# Patient Record
Sex: Male | Born: 1990 | Race: White | Hispanic: No | Marital: Single | Smoking: Never smoker
Health system: Southern US, Community
[De-identification: ages and names within clinical notes are randomized; demographics above are authoritative.]

---

## 2016-10-30 ENCOUNTER — Encounter: Payer: Self-pay | Admitting: *Deleted

## 2016-10-30 ENCOUNTER — Emergency Department: Payer: Managed Care, Other (non HMO)

## 2016-10-30 ENCOUNTER — Emergency Department
Admission: EM | Admit: 2016-10-30 | Discharge: 2016-10-30 | Disposition: A | Discharge status: Home / Self Care | Payer: Managed Care, Other (non HMO) | Attending: Emergency Medicine | Admitting: Emergency Medicine

## 2016-10-30 DIAGNOSIS — S3992XA Unspecified injury of lower back, initial encounter: Secondary | ICD-10-CM | POA: Diagnosis present

## 2016-10-30 DIAGNOSIS — Y999 Unspecified external cause status: Secondary | ICD-10-CM | POA: Diagnosis not present

## 2016-10-30 DIAGNOSIS — Y9239 Other specified sports and athletic area as the place of occurrence of the external cause: Secondary | ICD-10-CM | POA: Diagnosis not present

## 2016-10-30 DIAGNOSIS — S39012A Strain of muscle, fascia and tendon of lower back, initial encounter: Secondary | ICD-10-CM | POA: Diagnosis not present

## 2016-10-30 DIAGNOSIS — X500XXA Overexertion from strenuous movement or load, initial encounter: Secondary | ICD-10-CM | POA: Insufficient documentation

## 2016-10-30 DIAGNOSIS — Y93F2 Activity, caregiving, lifting: Secondary | ICD-10-CM | POA: Insufficient documentation

## 2016-10-30 MED ORDER — CYCLOBENZAPRINE HCL 10 MG PO TABS: 10.0000 mg | ORAL_TABLET | Freq: Three times a day (TID) | ORAL | 0 refills | Status: DC | PRN

## 2016-10-30 MED ORDER — KETOROLAC TROMETHAMINE 30 MG/ML IJ SOLN
30.0000 mg | Freq: Once | INTRAMUSCULAR | Status: AC
  Administered 2016-10-30: 30 mg via INTRAMUSCULAR
  Filled 2016-10-30: qty 1

## 2016-10-30 MED ORDER — NAPROXEN 500 MG PO TABS: 500.0000 mg | ORAL_TABLET | Freq: Two times a day (BID) | ORAL | 0 refills | Status: DC

## 2016-10-30 NOTE — ED Provider Notes (Signed)
[facility] Emergency Department Provider Note  ____________________________________________  Time seen: Approximately 11:46 AM  I have reviewed the triage vital signs and the nursing notes.   HISTORY  Chief Complaint Back Pain    HPI Roberto Salas is a 26 y.o. male , NAD, presents to the emergency department for evaluation of low back pain. Patient states he did left and 400 pounds of weight yesterday while at the gym. Had onset of lower back pain after such. [place] onset of numbness, weakness or tingling. Had no saddle paresthesias or loss of bowel or bladder control. States when he woke this morning his pain significantly escalated and he felt somewhat tight. Has not taken anything for his symptoms. Denies fall or blunt trauma or injury to his back. Has not noted any redness, swelling or bruising. Does note that he typically lives approximately 250 pounds without difficulty but was trying to assess his maximum weight limit for lifting.   History reviewed. No pertinent past medical history.  There are no active problems to display for this patient.   No past surgical history on file.  Prior to Admission medications   Not on File    Allergies Patient has no known allergies.  History reviewed. No pertinent family history.  Social History Social History  Substance Use Topics  . Smoking status: Not on file  . Smokeless tobacco: Not on file  . Alcohol use Not on file     Review of Systems  Constitutional: No fatigue Cardiovascular: No chest pain. Respiratory: No shortness of breath.  Musculoskeletal: Positive for lower back pain. No neck pain. Skin: Negative for rash, Redness, swelling, bruising. Neurological: Negative for Numbness, weakness, tingling. No saddle paresthesias or loss of bowel or bladder control. 10-point ROS otherwise negative.  ____________________________________________   PHYSICAL EXAM:  VITAL SIGNS: ED Triage  Vitals  Enc Vitals Group     BP 10/30/16 1124 (!) 162/91     Pulse Rate 10/30/16 1124 72     Resp 10/30/16 1124 18     Temp 10/30/16 1124 98.5 F (36.9 C)     Temp Source 10/30/16 1124 Oral     SpO2 10/30/16 1124 98 %     Weight 10/30/16 1123 227 lb (103 kg)     Height 10/30/16 1123 5\' 11"  (1.803 m)     Head Circumference --      Peak Flow --      Pain Score 10/30/16 1123 7     Pain Loc --      Pain Edu? --      Excl. in GC? --      Constitutional: Alert and oriented. Well appearing and in no acute distress. Eyes: Conjunctivae are normal.  Head: Atraumatic. Neck: No cervical spine tenderness to palpation. Supple with full range of motion. Hematological/Lymphatic/Immunilogical: No cervical lymphadenopathy. Cardiovascular:  Good peripheral circulation. Respiratory: Normal respiratory effort without tachypnea or retractions.  Musculoskeletal: Tenderness to palpation of the midline lower area of the lumbar spine without bony abnormalities or step off. Mild paraspinal tenderness bilaterally about the lumbar spine. No SI joint tenderness bilaterally. Negative bilateral straight leg raise. No lower extremity tenderness nor edema.  No joint effusions. Neurologic:  Normal speech and language. Normal gait and posture. No gross focal neurologic deficits are appreciated. Sensation to light touch grossly intact by bilateral lower extremities. Skin:  Skin is warm, dry and intact. No rash, redness, swelling, bruising noted. Psychiatric: Mood and affect are normal. Speech and behavior are  normal. Patient exhibits appropriate insight and judgement.   ____________________________________________   LABS  None ____________________________________________  EKG  None ____________________________________________  RADIOLOGY I, Ernestene Kiel Kyrielle Urbanski, personally viewed and evaluated these images (plain radiographs) as part of my medical decision making, as well as reviewing the written report by the  radiologist.  Dg Lumbar Spine 2-3 Views  Result Date: 10/30/2016 CLINICAL DATA:  Worsening low back pain with pain and tingling extending into the left leg since injury lifting 400 pound weight yesterday. EXAM: LUMBAR SPINE - 2-3 VIEW COMPARISON:  None in PACs FINDINGS: There is a transitional S1 vertebral body. The lumbar vertebral bodies are preserved in height. The disc space heights are well maintained. There is no spondylolisthesis of L1 through L5. There is mild anterolisthesis of S1 with respect L5 amounting to approximately 5 mm. There is no pars defect. The pedicles and transverse processes are intact. The observed portions of the sacrum exhibit no acute abnormality. IMPRESSION: There is no acute bony abnormality of the lumbar spine. There is no significant disc space narrowing. If the patient's symptoms persist, lumbar spine MRI would be a useful next imaging step. Electronically Signed   By: David  [place] M.D.   On: 10/30/2016 12:06    ____________________________________________    PROCEDURES  Procedure(s) performed: None   Procedures   Medications  ketorolac (TORADOL) 30 MG/ML injection 30 mg (30 mg Intramuscular Given 10/30/16 1150)    ____________________________________________   INITIAL IMPRESSION / ASSESSMENT AND PLAN / ED COURSE  Pertinent labs & imaging results that were available during my care of the patient were reviewed by me and considered in my medical decision making (see chart for details).     Patient's diagnosis is consistent with Strain of lumbar region. Patient was given IM Toradol while in the emergency department and tolerated well with notable decrease in pain. Patient will be discharged home with prescriptions for naproxen and Flexeril to take as directed. Patient is to alternate applying heat and ice to the affected area 20 minutes 3-4 times daily. Light range of motion and stretching as discussed. No weightlifting or gym workouts over the next 7  days. Patient is to follow up with Dr. Hyacinth Meeker in orthopedics if symptoms persist past this treatment course. Patient is given ED precautions to return to the ED for any worsening or new symptoms.   ____________________________________________  FINAL CLINICAL IMPRESSION(S) / ED DIAGNOSES  Final diagnoses:  Strain of lumbar region, initial encounter    NEW MEDICATIONS STARTED DURING THIS VISIT:  New Prescriptions   No medications on file         Hope Pigeon, PA-C 10/31/16 1151    Jene Every, MD 10/31/16 1234

## 2016-10-30 NOTE — Discharge Instructions (Signed)
No heavy lifting over the next 5 days. Ice to the affected area 20 minutes 3-4 times daily as needed for the first 2 days then switch to warm heat. Please complete light range of motion and stretching exercises daily.

## 2016-10-30 NOTE — ED Notes (Signed)
Provider notified about BP  , pt informed about following up to recheck BP

## 2016-10-30 NOTE — ED Triage Notes (Signed)
States lower back pain after working out and doing a dead Mudloggerlift

## 2017-03-05 ENCOUNTER — Emergency Department
Admission: EM | Admit: 2017-03-05 | Discharge: 2017-03-05 | Disposition: A | Discharge status: Home / Self Care | Payer: Managed Care, Other (non HMO) | Attending: Emergency Medicine | Admitting: Emergency Medicine

## 2017-03-05 ENCOUNTER — Encounter: Payer: Self-pay | Admitting: Emergency Medicine

## 2017-03-05 DIAGNOSIS — Y929 Unspecified place or not applicable: Secondary | ICD-10-CM | POA: Insufficient documentation

## 2017-03-05 DIAGNOSIS — Y939 Activity, unspecified: Secondary | ICD-10-CM | POA: Diagnosis not present

## 2017-03-05 DIAGNOSIS — Y999 Unspecified external cause status: Secondary | ICD-10-CM | POA: Diagnosis not present

## 2017-03-05 DIAGNOSIS — S39012A Strain of muscle, fascia and tendon of lower back, initial encounter: Secondary | ICD-10-CM | POA: Insufficient documentation

## 2017-03-05 DIAGNOSIS — X509XXA Other and unspecified overexertion or strenuous movements or postures, initial encounter: Secondary | ICD-10-CM | POA: Diagnosis not present

## 2017-03-05 DIAGNOSIS — S3992XA Unspecified injury of lower back, initial encounter: Secondary | ICD-10-CM | POA: Diagnosis present

## 2017-03-05 MED ORDER — METHOCARBAMOL 500 MG PO TABS
1000.0000 mg | ORAL_TABLET | Freq: Once | ORAL | Status: AC
  Administered 2017-03-05: 1000 mg via ORAL
  Filled 2017-03-05: qty 2

## 2017-03-05 MED ORDER — IBUPROFEN 800 MG PO TABS: 800.0000 mg | ORAL_TABLET | Freq: Three times a day (TID) | ORAL | 0 refills | Status: DC

## 2017-03-05 MED ORDER — KETOROLAC TROMETHAMINE 30 MG/ML IJ SOLN
30.0000 mg | Freq: Once | INTRAMUSCULAR | Status: AC
  Administered 2017-03-05: 30 mg via INTRAMUSCULAR
  Filled 2017-03-05: qty 1

## 2017-03-05 MED ORDER — METHOCARBAMOL 500 MG PO TABS: ORAL_TABLET | ORAL | 0 refills | Status: DC

## 2017-03-05 MED ORDER — METHOCARBAMOL 1000 MG/10ML IJ SOLN: 1000.0000 mg | Freq: Once | INTRAMUSCULAR | Status: DC

## 2017-03-05 NOTE — Discharge Instructions (Signed)
Moist heat or ice to your  back as needed for comfort. Continue taking medication as directed. Do not drive or operate machinery while taking Robaxin (methocarbamol). Follow-up with your primary care or can no clinic if any continued problems.

## 2017-03-05 NOTE — ED Provider Notes (Signed)
[facility] Emergency Department Provider Note  ____________________________________________   First MD Initiated Contact with Patient 03/05/17 [phone]     (approximate)  I have reviewed the triage vital signs and the nursing notes.   HISTORY  Chief Complaint Back Pain    HPI Roberto Salas is a 26 y.o. male patient is here with complaint of back pain when he bent over to pick up something last night. He states that he felt "a pop in his back". Patient has had problems with his back and was seen here after lifting weights and having low back pain. At that time he was told his back x-rays were negative. Patient denies any bowel or bladder incontinence. He denies any paresthesias to his lower extremities or saddle anesthesias. Patient has continued to be ambulatory. He states that movement increases his pain. Currently rates his pain is 7 out of 10. Patient has continued to be ambulatory.   History reviewed. No pertinent past medical history.  There are no active problems to display for this patient.   History reviewed. No pertinent surgical history.  Prior to Admission medications   Medication Sig Start Date End Date Taking? Authorizing Provider  ibuprofen (ADVIL,MOTRIN) 800 MG tablet Take 1 tablet (800 mg total) by mouth 3 (three) times daily. 03/05/17   Tommi Rumps, PA-C  methocarbamol (ROBAXIN) 500 MG tablet 1-2 tablets qid prn spasms 03/05/17   Tommi Rumps, PA-C    Allergies Patient has no known allergies.  History reviewed. No pertinent family history.  Social History Social History  Substance Use Topics  . Smoking status: Never Smoker  . Smokeless tobacco: Never Used  . Alcohol use Yes    Review of Systems Constitutional: No fever/chills Cardiovascular: Denies chest pain. Respiratory: Denies shortness of breath. Gastrointestinal: No abdominal pain.  No nausea, no vomiting.  Genitourinary: Negative for  dysuria. Musculoskeletal: Positive for back pain. Neurological: Negative for focal weakness or numbness.   ____________________________________________   PHYSICAL EXAM:  VITAL SIGNS: ED Triage Vitals  Enc Vitals Group     BP 03/05/17 0757 138/85     Pulse Rate 03/05/17 0757 (!) 58     Resp 03/05/17 0757 20     Temp 03/05/17 0757 97.9 F (36.6 C)     Temp Source 03/05/17 0757 Oral     SpO2 03/05/17 0757 97 %     Weight 03/05/17 0757 223 lb (101.2 kg)     Height 03/05/17 0757 5\' 11"  (1.803 m)     Head Circumference --      Peak Flow --      Pain Score 03/05/17 0756 7     Pain Loc --      Pain Edu? --      Excl. in GC? --     Constitutional: Alert and oriented. Well appearing and in no acute distress. Eyes: Conjunctivae are normal.  Head: Atraumatic. Neck: No stridor.   Cardiovascular: Normal rate, regular rhythm. Grossly normal heart sounds.  Good peripheral circulation. Respiratory: Normal respiratory effort.  No retractions. Lungs CTAB. Gastrointestinal: Soft and nontender. No distention.  No CVA tenderness. Musculoskeletal: Examinations back there is no gross deformity noted. There is no tenderness on palpation of the vertebral bodies lumbar spine. There is some tenderness on the paravertebral muscles bilaterally. Range of motion is slightly restricted secondary to discomfort. No active muscle spasms were seen. Straight leg raises were limited to approximately 40 secondary to pain bilaterally. Reflexes were equal bilaterally. Good  muscle strength and patient was ambulatory. Neurologic:  Normal speech and language. No gross focal neurologic deficits are appreciated. No gait instability. Skin:  Skin is warm, dry and intact. No rash noted. Psychiatric: Mood and affect are normal. Speech and behavior are normal.  ____________________________________________   LABS (all labs ordered are listed, but only abnormal results are displayed)  Labs Reviewed - No data to  display  PROCEDURES  Procedure(s) performed: None  Procedures  Critical Care performed: No  ____________________________________________   INITIAL IMPRESSION / ASSESSMENT AND PLAN / ED COURSE  Pertinent labs & imaging results that were available during my care of the patient were reviewed by me and considered in my medical decision making (see chart for details).  Patient was given Toradol 30 mg IM along with Robaxin 1000 mg by mouth. Patient had moderate amount of improvement prior to discharge. Patient was discharged with prescription for Robaxin one or 2 tablets 4 times a day as needed for muscle spasms and ibuprofen 800 mg 3 times a day with food. He is encouraged to use ice or heat to his back as needed.      ____________________________________________   FINAL CLINICAL IMPRESSION(S) / ED DIAGNOSES  Final diagnoses:  Strain of lumbar region, initial encounter      NEW MEDICATIONS STARTED DURING THIS VISIT:  Discharge Medication List as of 03/05/2017  9:24 AM    START taking these medications   Details  ibuprofen (ADVIL,MOTRIN) 800 MG tablet Take 1 tablet (800 mg total) by mouth 3 (three) times daily., Starting Thu 03/05/2017, Print    methocarbamol (ROBAXIN) 500 MG tablet 1-2 tablets qid prn spasms, Print         Note:  This document was prepared using Dragon voice recognition software and may include unintentional dictation errors.    Tommi RumpsSummers, Maha Fischel L, PA-C 03/05/17 1558    Governor RooksLord, Rebecca, MD 03/06/17 [phone]

## 2017-03-05 NOTE — ED Triage Notes (Signed)
Pt reports hx of back pain when lifting weights several months ago, states last night he bent over to pick up something he felt his back pop. C/o low back pain worse with movement. No bowel or bladder loss. Ambulatory in lobby.

## 2017-07-05 IMAGING — CR DG LUMBAR SPINE 2-3V
1 series · 3 of 3 positions shown · non-contrast
Comparison: None in PACs

CLINICAL DATA: Worsening low back pain with pain and tingling
extending into the left leg since injury lifting 400 pound weight
yesterday.

EXAM:
LUMBAR SPINE - 2-3 VIEW

[Series 1: t lumbar spine ap · 0.14mm/px · 3 of 3 slices shown]
[im 1/3]
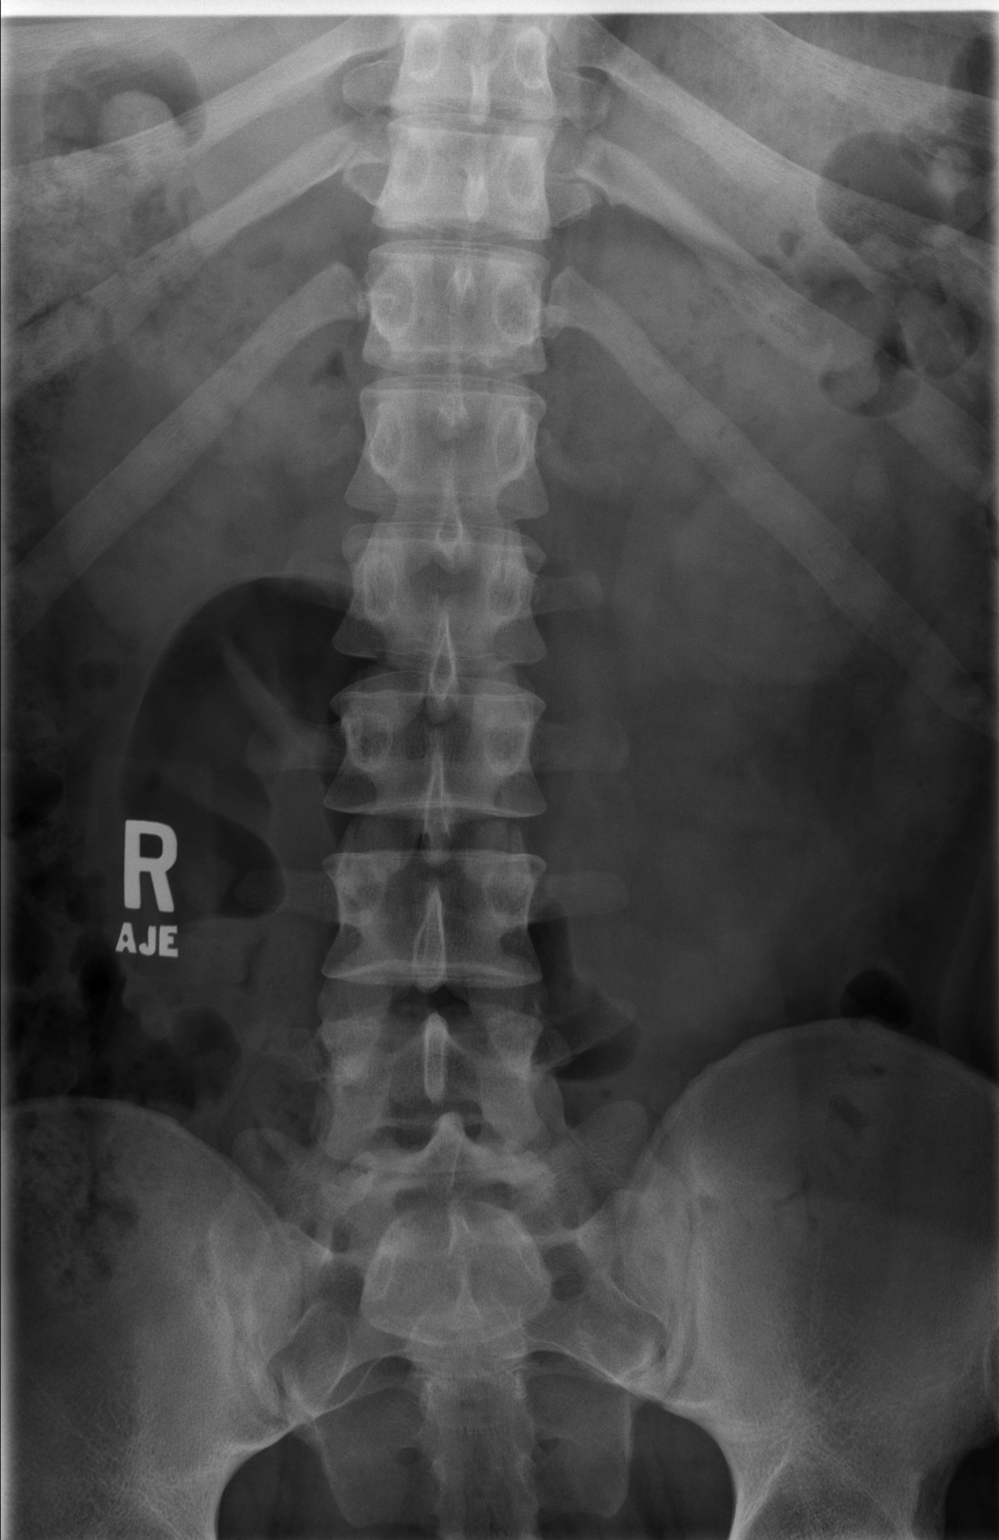
[im 2/3]
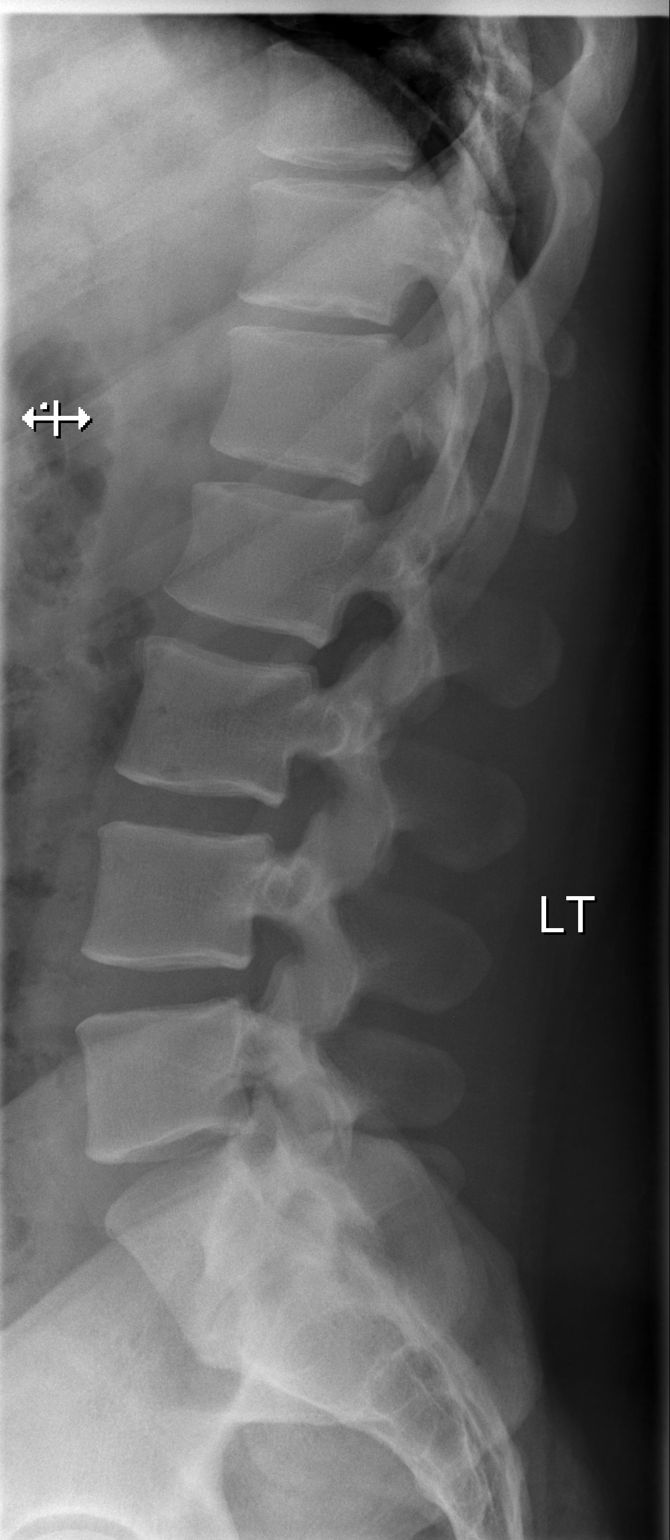
[im 3/3]
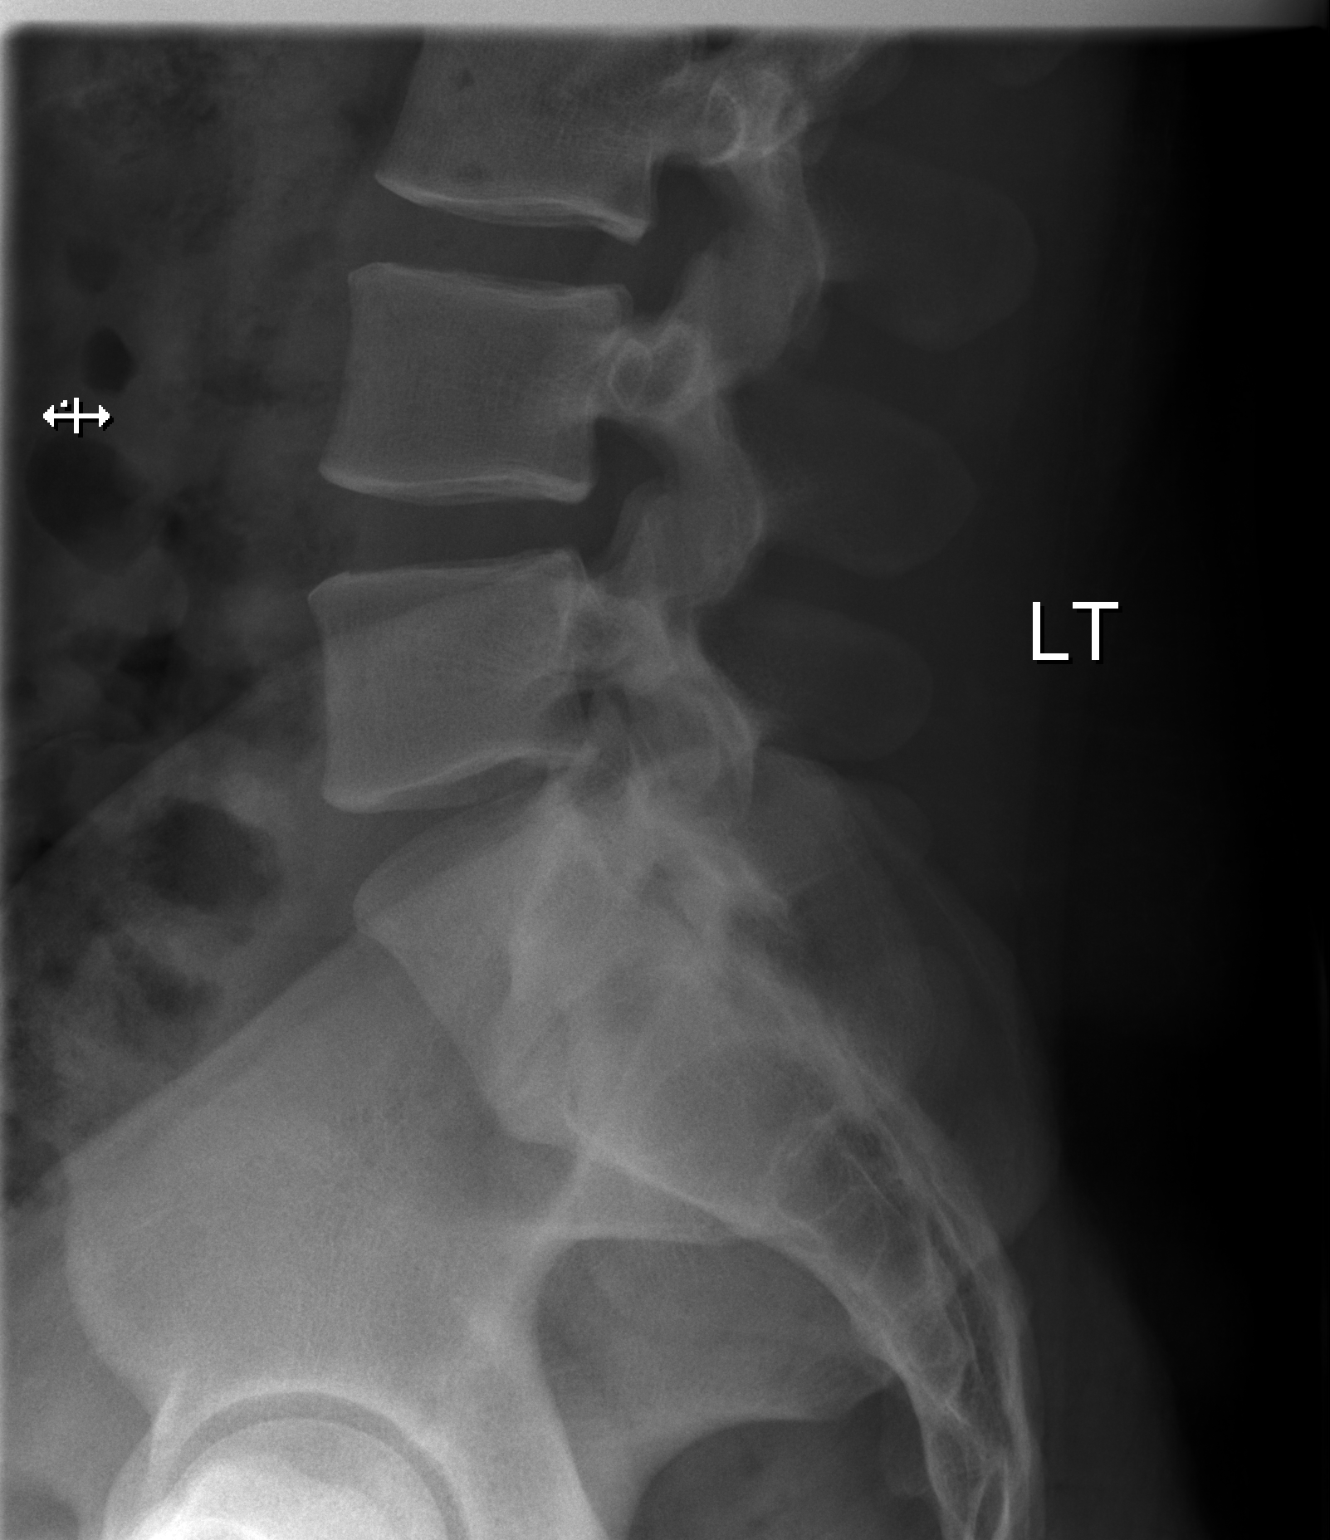

[3 of 3 positions shown; findings below may reference images not displayed]

FINDINGS: There is a transitional S1 vertebral body. The lumbar vertebral
bodies are preserved in height. The disc space heights are well
maintained. There is no spondylolisthesis of L1 through L5. There is
mild anterolisthesis of S1 with respect L5 amounting to
approximately 5 mm. There is no pars defect. The pedicles and
transverse processes are intact. The observed portions of the sacrum
exhibit no acute abnormality.
IMPRESSION: There is no acute bony abnormality of the lumbar spine. There is no
significant disc space narrowing. If the patient's symptoms persist,
lumbar spine MRI would be a useful next imaging step.

## 2019-05-19 ENCOUNTER — Emergency Department
Admission: EM | Admit: 2019-05-19 | Discharge: 2019-05-19 | Disposition: A | Discharge status: Home / Self Care | Payer: Commercial Managed Care - PPO | Attending: Emergency Medicine | Admitting: Emergency Medicine

## 2019-05-19 ENCOUNTER — Other Ambulatory Visit: Payer: Self-pay

## 2019-05-19 DIAGNOSIS — Z791 Long term (current) use of non-steroidal anti-inflammatories (NSAID): Secondary | ICD-10-CM | POA: Insufficient documentation

## 2019-05-19 DIAGNOSIS — K0889 Other specified disorders of teeth and supporting structures: Secondary | ICD-10-CM | POA: Diagnosis not present

## 2019-05-19 MED ORDER — KETOROLAC TROMETHAMINE 30 MG/ML IJ SOLN
30.0000 mg | Freq: Once | INTRAMUSCULAR | Status: AC
  Administered 2019-05-19: 23:00:00 30 mg via INTRAMUSCULAR
  Filled 2019-05-19: qty 1

## 2019-05-19 MED ORDER — ONDANSETRON 4 MG PO TBDP
4.0000 mg | ORAL_TABLET | Freq: Once | ORAL | Status: AC
  Administered 2019-05-19: 23:00:00 4 mg via ORAL
  Filled 2019-05-19: qty 1

## 2019-05-19 MED ORDER — KETOROLAC TROMETHAMINE 10 MG PO TABS: 10.0000 mg | ORAL_TABLET | Freq: Four times a day (QID) | ORAL | 0 refills | Status: AC | PRN

## 2019-05-19 MED ORDER — OXYCODONE-ACETAMINOPHEN 5-325 MG PO TABS
1.0000 | ORAL_TABLET | Freq: Once | ORAL | Status: AC
  Administered 2019-05-19: 1 via ORAL
  Filled 2019-05-19: qty 1

## 2019-05-19 NOTE — ED Notes (Signed)
No answer when called several times from lobby 

## 2019-05-19 NOTE — Discharge Instructions (Signed)
OPTIONS FOR DENTAL FOLLOW UP CARE ° °[place] [facility] and [facility] - Local Safety Net Dental Clinics °http://www.ncdhhs.gov/dph/oralhealth/services/safetynetclinics.htm °  °[facility] (336-562-3123) ° °[facility] [place] ([phone]) ° °[facility] ([phone] ext 237) ° °[facility] ([phone]) ° °[facility] ([phone]) °This clinic caters to the indigent population and is on a lottery system. °Location: °[facility], [address], [place] °Clinic Hours: °Wednesdays from 6pm - 9pm, patients seen by a lottery system. °For dates, call or go to www.med.[facility].edu/[facility]/patients/Dental-[facility] °Services: °Cleanings, fillings and simple extractions. °Payment Options: °DENTAL WORK IS FREE OF CHARGE. Bring proof of income or support. °Best way to get seen: °Arrive at 5:15 pm - this is a lottery, NOT first come/first serve, so arriving earlier will not increase your chances of being seen. °  °  °[facility] °[phone] °Select option 1 for emergencies °  °Location: °[facility], [address], [place] °Clinic Hours: °No walk-ins accepted - call the day before to schedule an appointment. °Check in times are 9:30 am and 1:30 pm. °Services: °Simple extractions, temporary fillings, pulpectomy/pulp debridement, uncomplicated abscess drainage. °Payment Options: °PAYMENT IS DUE AT THE TIME OF SERVICE.  Fee is usually $100-200, additional surgical procedures (e.g. abscess drainage) may be extra. °Cash, checks, Visa/MasterCard accepted.  Can file Medicaid if patient is covered for dental - patient should call case worker to check. °No discount for [facility] patients. °Best way to get seen: °MUST call the day before and get onto the schedule. Can usually be seen the next 1-2 days. No walk-ins accepted. °  °  °[facility] °[phone] °   °Location: °[facility], [address], [place] °Clinic Hours: °M, W, Th, F 8am or 1:30pm, Tues 9a or 1:30 - first come/first served. °Services: °Simple extractions, temporary fillings, uncomplicated abscess drainage.  You do not need to be an [place] resident. °Payment Options: °PAYMENT IS DUE AT THE TIME OF SERVICE. °Dental insurance, otherwise sliding scale - bring proof of income or support. °Depending on income and treatment needed, cost is usually $50-200. °Best way to get seen: °Arrive early as it is first come/first served. °  °  °[facility] °[phone] °  °Location: °[address] °Clinic Hours: °Mon-Thu 8a-5p °Services: °Most basic dental services including extractions and fillings. °Payment Options: °PAYMENT IS DUE AT THE TIME OF SERVICE. °Sliding scale, up to 50% off - bring proof if income or support. °Medicaid with dental option accepted. °Best way to get seen: °Call to schedule an appointment, can usually be seen within 2 weeks OR they will try to see walk-ins - show up at 8a or 2p (you may have to wait). °  °  °[facility] °919-245-2435 °[place] RESIDENTS ONLY °  °Location: °[facility], [address], [place], [place] [address] °Clinic Hours: By appointment only. °Monday - Thursday 8am-5pm, Friday 8am-12pm °Services: Cleanings, fillings, extractions. °Payment Options: °PAYMENT IS DUE AT THE TIME OF SERVICE. °Cash, Visa or MasterCard. Sliding scale - $30 minimum per service. °Best way to get seen: °Come in to office, complete packet and make an appointment - need proof of income °or support monies for each household member and proof of [place] residence. °Usually takes about a month to get in. °  °  °[facility] °[phone] °  °Location: °[address]   [place] °Clinic Hours: Walk-in Urgent Care Dental Services are offered Monday-Friday  mornings only. °The numbers of emergencies accepted daily is limited to the number of °providers available. °Maximum 15 - Mondays, Wednesdays & Thursdays °Maximum 10 - Tuesdays & Fridays °Services: °You do not need to be a [place] resident to be seen for a dental emergency. °Emergencies are defined as pain, swelling, abnormal bleeding, or dental trauma. Walkins will receive x-rays if needed. °NOTE: Dental cleaning is not an emergency. °Payment Options: °PAYMENT IS DUE AT THE TIME OF SERVICE. °Minimum co-pay is $40.00 for uninsured patients. °Minimum co-pay is $3.00 for Medicaid with dental coverage. °Dental Insurance is accepted and must be presented at time of visit. °Medicare does not cover dental. °Forms of payment: Cash, credit card, checks. °Best way to get seen: °If not previously registered with the clinic, walk-in dental registration begins at 7:15 am and is on a first come/first serve basis. °If previously registered with the clinic, call to make an appointment. °  °  °[facility] °[phone] °[place] RESIDENTS ONLY °  °Location: °[address], [place], [place] °Clinic Hours: °Mon-Thu 10a-2p °Services: Extractions only! °Payment Options: °FREE (donations accepted) - bring proof of income or support °Best way to get seen: °Call and schedule an appointment OR come at 8am on the 1st Monday of every month (except for holidays) when it is first come/first served. °  °  °[facility] °[phone] °  °Location: °[address], [place] °Clinic Hours: °Friday mornings °Services, Payment Options, Best way to get seen: °Call for info °

## 2019-05-19 NOTE — ED Triage Notes (Signed)
Pt arrives to ED via POV from home with c/o left, lower-sided dental pain x14 hrs. No fever; no trouble swallowing or speaking. Pt reports taking IBU q4-6hrs without relief.

## 2019-05-19 NOTE — ED Notes (Signed)
Unable to obtain oral temp; pt drinking water in Triage.

## 2019-05-19 NOTE — ED Provider Notes (Signed)
[facility] Emergency Department Provider Note  ____________________________________________  Time seen: Approximately 11:22 PM  I have reviewed the triage vital signs and the nursing notes.   HISTORY  Chief Complaint Dental Pain    HPI Roberto Salas is a 28 y.o. male presents to the emergency department with a broken inferior 21 tooth.  Patient has an appointment with a local dentist tomorrow and states that he is seeking pain management so that he can rest tonight.  He denies pain underneath the tongue or swelling at the neck.  No fever noted at home.  Patient has been taking ibuprofen and Tylenol.        History reviewed. No pertinent past medical history.  There are no active problems to display for this patient.   History reviewed. No pertinent surgical history.  Prior to Admission medications   Medication Sig Start Date End Date Taking? Authorizing Provider  ibuprofen (ADVIL,MOTRIN) 800 MG tablet Take 1 tablet (800 mg total) by mouth 3 (three) times daily. 03/05/17   Tommi RumpsSummers, Rhonda L, PA-C  ketorolac (TORADOL) 10 MG tablet Take 1 tablet (10 mg total) by mouth every 6 (six) hours as needed for up to 5 days. 05/19/19 05/24/19  Orvil FeilWoods, Amalee Olsen M, PA-C  methocarbamol (ROBAXIN) 500 MG tablet 1-2 tablets qid prn spasms 03/05/17   Tommi RumpsSummers, Rhonda L, PA-C    Allergies Patient has no known allergies.  No family history on file.  Social History Social History   Tobacco Use  . Smoking status: Never Smoker  . Smokeless tobacco: Never Used  Substance Use Topics  . Alcohol use: Yes  . Drug use: Not on file     Review of Systems  Constitutional: No fever/chills Eyes: No visual changes. No discharge ENT: Patient has dental pain. Cardiovascular: no chest pain. Respiratory: no cough. No SOB. Gastrointestinal: No abdominal pain.  No nausea, no vomiting.  No diarrhea.  No constipation. Genitourinary: Negative for dysuria. No  hematuria Musculoskeletal: Negative for musculoskeletal pain. Skin: Negative for rash, abrasions, lacerations, ecchymosis. Neurological: Negative for headaches, focal weakness or numbness.   ____________________________________________   PHYSICAL EXAM:  VITAL SIGNS: ED Triage Vitals  Enc Vitals Group     BP 05/19/19 2202 (!) 159/115     Pulse Rate 05/19/19 2202 (!) 58     Resp 05/19/19 2202 18     Temp --      Temp src --      SpO2 05/19/19 2202 99 %     Weight 05/19/19 2203 246 lb (111.6 kg)     Height 05/19/19 2203 5\' 11"  (1.803 m)     Head Circumference --      Peak Flow --      Pain Score 05/19/19 2203 10     Pain Loc --      Pain Edu? --      Excl. in GC? --      Constitutional: Alert and oriented. Well appearing and in no acute distress. Eyes: Conjunctivae are normal. PERRL. EOMI. Head: Atraumatic. ENT:      Ears:       Nose: No congestion/rhinnorhea.      Mouth/Throat: Mucous membranes are moist.  Patient has broken inferior 21.  No pain to palpation underneath the tongue. Neck: No stridor.  No cervical spine tenderness to palpation. Hematological/Lymphatic/Immunilogical: No cervical lymphadenopathy. Cardiovascular: Normal rate, regular rhythm. Normal S1 and S2.  Good peripheral circulation. Respiratory: Normal respiratory effort without tachypnea or retractions. Lungs CTAB. Good air  entry to the bases with no decreased or absent breath sounds. Musculoskeletal: Full range of motion to all extremities. No gross deformities appreciated. Neurologic:  Normal speech and language. No gross focal neurologic deficits are appreciated.  Skin:  Skin is warm, dry and intact. No rash noted. Psychiatric: Mood and affect are normal. Speech and behavior are normal. Patient exhibits appropriate insight and judgement.   ____________________________________________   LABS (all labs ordered are listed, but only abnormal results are displayed)  Labs Reviewed - No data to  display ____________________________________________  EKG   ____________________________________________  RADIOLOGY   No results found.  ____________________________________________    PROCEDURES  Procedure(s) performed:    Procedures    Medications  oxyCODONE-acetaminophen (PERCOCET/ROXICET) 5-325 MG per tablet 1 tablet (has no administration in time range)  ondansetron (ZOFRAN-ODT) disintegrating tablet 4 mg (has no administration in time range)  ketorolac (TORADOL) 30 MG/ML injection 30 mg (has no administration in time range)     ____________________________________________   INITIAL IMPRESSION / ASSESSMENT AND PLAN / ED COURSE  Pertinent labs & imaging results that were available during my care of the patient were reviewed by me and considered in my medical decision making (see chart for details).  Review of the [place] CSRS was performed in accordance of the [place] prior to dispensing any controlled drugs.          Assessment and Plan:  Dental pain 28 year old male presents to the emergency department with a broken inferior 21.  Patient is presenting to the emergency department for pain management.  [place] drug database was reviewed during this emergency department encounter.  Patient was given Percocet and Toradol for pain.  He was advised to keep appointment with local dentist tomorrow.  All patient questions were answered.  ____________________________________________  FINAL CLINICAL IMPRESSION(S) / ED DIAGNOSES  Final diagnoses:  Pain, dental      NEW MEDICATIONS STARTED DURING THIS VISIT:  ED Discharge Orders         Ordered    ketorolac (TORADOL) 10 MG tablet  Every 6 hours PRN     05/19/19 2320              This chart was dictated using voice recognition software/Dragon. Despite best efforts to proofread, errors can occur which can change the meaning. Any change was purely unintentional.    Lannie Fields, PA-C 05/19/19  2329    Harvest Dark, MD 05/20/19 2038

## 2020-12-11 ENCOUNTER — Emergency Department
Admission: EM | Admit: 2020-12-11 | Discharge: 2020-12-11 | Disposition: A | Discharge status: Home / Self Care | Payer: Commercial Managed Care - PPO | Attending: Emergency Medicine | Admitting: Emergency Medicine

## 2020-12-11 ENCOUNTER — Other Ambulatory Visit: Payer: Self-pay

## 2020-12-11 DIAGNOSIS — M5416 Radiculopathy, lumbar region: Secondary | ICD-10-CM | POA: Diagnosis not present

## 2020-12-11 DIAGNOSIS — X500XXA Overexertion from strenuous movement or load, initial encounter: Secondary | ICD-10-CM | POA: Insufficient documentation

## 2020-12-11 DIAGNOSIS — Y93F2 Activity, caregiving, lifting: Secondary | ICD-10-CM | POA: Diagnosis not present

## 2020-12-11 DIAGNOSIS — M545 Low back pain, unspecified: Secondary | ICD-10-CM | POA: Diagnosis present

## 2020-12-11 MED ORDER — IBUPROFEN 800 MG PO TABS: 800.0000 mg | ORAL_TABLET | Freq: Three times a day (TID) | ORAL | 0 refills | Status: AC | PRN

## 2020-12-11 MED ORDER — PREDNISONE 10 MG (21) PO TBPK: ORAL_TABLET | ORAL | 0 refills | Status: AC

## 2020-12-11 MED ORDER — OXYCODONE-ACETAMINOPHEN 5-325 MG PO TABS: 2.0000 | ORAL_TABLET | Freq: Four times a day (QID) | ORAL | 0 refills | Status: AC | PRN

## 2020-12-11 MED ORDER — KETOROLAC TROMETHAMINE 30 MG/ML IJ SOLN
30.0000 mg | Freq: Once | INTRAMUSCULAR | Status: AC
  Administered 2020-12-11: 30 mg via INTRAVENOUS
  Filled 2020-12-11: qty 1

## 2020-12-11 MED ORDER — METHOCARBAMOL 1000 MG/10ML IJ SOLN
500.0000 mg | Freq: Once | INTRAMUSCULAR | Status: AC
  Administered 2020-12-11: 500 mg via INTRAVENOUS
  Filled 2020-12-11: qty 5

## 2020-12-11 MED ORDER — DEXAMETHASONE SODIUM PHOSPHATE 10 MG/ML IJ SOLN
10.0000 mg | Freq: Once | INTRAMUSCULAR | Status: AC
  Administered 2020-12-11: 10 mg via INTRAVENOUS
  Filled 2020-12-11: qty 1

## 2020-12-11 MED ORDER — ONDANSETRON 4 MG PO TBDP: 4.0000 mg | ORAL_TABLET | Freq: Four times a day (QID) | ORAL | 0 refills | Status: AC | PRN

## 2020-12-11 MED ORDER — LIDOCAINE 5 % EX PTCH: 1.0000 | MEDICATED_PATCH | Freq: Two times a day (BID) | CUTANEOUS | 0 refills | Status: AC

## 2020-12-11 MED ORDER — HYDROMORPHONE HCL 1 MG/ML IJ SOLN
1.0000 mg | Freq: Once | INTRAMUSCULAR | Status: AC
Start: 2020-12-11 — End: 2020-12-11
  Administered 2020-12-11: 1 mg via INTRAVENOUS
  Filled 2020-12-11: qty 1

## 2020-12-11 MED ORDER — HYDROMORPHONE HCL 1 MG/ML IJ SOLN
1.0000 mg | Freq: Once | INTRAMUSCULAR | Status: AC
  Administered 2020-12-11: 1 mg via INTRAVENOUS
  Filled 2020-12-11: qty 1

## 2020-12-11 NOTE — ED Provider Notes (Signed)
[facility] Emergency Department Provider Note  ____________________________________________   Event Date/Time   First MD Initiated Contact with Patient 12/11/20 [phone]     (approximate)  I have reviewed the triage vital signs and the nursing notes.   HISTORY  Chief Complaint Back Pain    HPI Roberto Salas is a 30 y.o. male with no significant past medical history who presents emergency department left lower sharp back pain and pain and numbness that goes down his posterior left leg that started 2 days ago.  No specific injury that he can recall.  Has been using ibuprofen throughout the night without much relief.  No fevers, weakness, bowel or bladder incontinence, urinary retention.  No history of HIV, diabetes, cancer, IV drug abuse.  No previous back surgeries or epidural injections.  Describes the pain as an 8/10.  Patient reports unable to ambulate due to pain.        History reviewed. No pertinent past medical history.  There are no problems to display for this patient.   History reviewed. No pertinent surgical history.  Prior to Admission medications   Medication Sig Start Date End Date Taking? Authorizing Provider  ibuprofen (ADVIL) 800 MG tablet Take 1 tablet (800 mg total) by mouth every 8 (eight) hours as needed for mild pain. 12/11/20  Yes Avante Carneiro N, DO  lidocaine (LIDODERM) 5 % Place 1 patch onto the skin every 12 (twelve) hours for 5 days. Remove & Discard patch within 12 hours or as directed by MD 12/11/20 12/16/20 Yes Kyrstal Monterrosa, Layla Maw, DO  ondansetron (ZOFRAN ODT) 4 MG disintegrating tablet Take 1 tablet (4 mg total) by mouth every 6 (six) hours as needed for nausea or vomiting. 12/11/20  Yes Red Mandt, Layla Maw, DO  oxyCODONE-acetaminophen (PERCOCET) 5-325 MG tablet Take 2 tablets by mouth every 6 (six) hours as needed for severe pain. 12/11/20 12/11/21 Yes Cyprian Gongaware, Layla Maw, DO  predniSONE (STERAPRED UNI-PAK 21 TAB) 10 MG (21) TBPK tablet  Take as directed 12/11/20  Yes Harvest Stanco, Layla Maw, DO    Allergies Patient has no known allergies.  History reviewed. No pertinent family history.  Social History Social History   Tobacco Use  . Smoking status: Never Smoker  . Smokeless tobacco: Never Used  Substance Use Topics  . Alcohol use: Yes    Review of Systems Constitutional: No fever. Eyes: No visual changes. ENT: No sore throat. Cardiovascular: Denies chest pain. Respiratory: Denies shortness of breath. Gastrointestinal: No nausea, vomiting, diarrhea. Genitourinary: Negative for dysuria. Musculoskeletal: Negative for back pain. Skin: Negative for rash. Neurological: Negative for focal weakness or numbness.  ____________________________________________   PHYSICAL EXAM:  VITAL SIGNS: ED Triage Vitals  Enc Vitals Group     BP 12/11/20 0608 (!) 151/99     Pulse Rate 12/11/20 0608 60     Resp 12/11/20 0608 16     Temp 12/11/20 0608 97.8 F (36.6 C)     Temp Source 12/11/20 0608 Oral     SpO2 12/11/20 0608 97 %     Weight 12/11/20 0613 228 lb (103.4 kg)     Height 12/11/20 0613 5\' 11"  (1.803 m)     Head Circumference --      Peak Flow --      Pain Score 12/11/20 0612 8     Pain Loc --      Pain Edu? --      Excl. in GC? --    CONSTITUTIONAL: Alert and oriented and  responds appropriately to questions.  Appears very uncomfortable.  Afebrile. HEAD: Normocephalic EYES: Conjunctivae clear, pupils appear equal, EOM appear intact ENT: normal nose; moist mucous membranes NECK: Supple, normal ROM CARD: RRR; S1 and S2 appreciated; no murmurs, no clicks, no rubs, no gallops RESP: Normal chest excursion without splinting or tachypnea; breath sounds clear and equal bilaterally; no wheezes, no rhonchi, no rales, no hypoxia or respiratory distress, speaking full sentences ABD/GI: Normal bowel sounds; non-distended; soft, non-tender, no rebound, no guarding, no peritoneal signs, no hepatosplenomegaly BACK: The back  appears normal, no midline spinal tenderness or step-off or deformity, tender to palpation over the left lumbar paraspinal muscles EXT: Normal ROM in all joints; no deformity noted, no edema; no cyanosis SKIN: Normal color for age and race; warm; no rash on exposed skin NEURO: Moves all extremities equally, no saddle anesthesia, numbness in the posterior left thigh and lateral left calf compared to right, otherwise sensation to light touch intact diffusely, strength 5/5 in all 4 extremities, 2+ deep tendon reflexes in bilateral lower extremities PSYCH: The patient's mood and manner are appropriate.  ____________________________________________   LABS (all labs ordered are listed, but only abnormal results are displayed)  Labs Reviewed - No data to display ____________________________________________  EKG  None ____________________________________________  RADIOLOGY I, Omaria Plunk, personally viewed and evaluated these images (plain radiographs) as part of my medical decision making, as well as reviewing the written report by the radiologist.  ED MD interpretation: None  Official radiology report(s): No results found.  ____________________________________________   PROCEDURES  Procedure(s) performed (including Critical Care):  Procedures   ____________________________________________   INITIAL IMPRESSION / ASSESSMENT AND PLAN / ED COURSE  As part of my medical decision making, I reviewed the following data within the electronic MEDICAL RECORD NUMBER History obtained from family, Nursing notes reviewed and incorporated, Old chart reviewed, Notes from prior ED visits and [place] Controlled Substance Database         Patient here with symptoms of lumbar radiculopathy.  He appears uncomfortable on exam.  Will give IV Robaxin, Toradol, Decadron.  He does not have red flag symptoms that would suggest fracture, epidural abscess or hematoma, discitis or osteomyelitis, transverse  myelitis, spinal stenosis, cauda equina.  I do not feel he needs emergent MRI at this time but have recommended close outpatient follow-up with his PCP.  ED PROGRESS  Patient reports no significant relief after Toradol, Decadron and Robaxin.  Will give Dilaudid and reassess.  Signed out to Dr. Larinda Buttery to follow-up on patient's pain and dispo as appropriate.   I reviewed all nursing notes and pertinent previous records as available.  I have reviewed and interpreted any EKGs, lab and urine results, imaging (as available).  ____________________________________________   FINAL CLINICAL IMPRESSION(S) / ED DIAGNOSES  Final diagnoses:  Lumbar radiculopathy     ED Discharge Orders         Ordered    ibuprofen (ADVIL) 800 MG tablet  Every 8 hours PRN        12/11/20 0717    oxyCODONE-acetaminophen (PERCOCET) 5-325 MG tablet  Every 6 hours PRN        12/11/20 0717    lidocaine (LIDODERM) 5 %  Every 12 hours        12/11/20 0717    predniSONE (STERAPRED UNI-PAK 21 TAB) 10 MG (21) TBPK tablet        12/11/20 0717    ondansetron (ZOFRAN ODT) 4 MG disintegrating tablet  Every 6 hours PRN  12/11/20 3235          *Please note:  ORY ELTING was evaluated in Emergency Department on 12/11/2020 for the symptoms described in the history of present illness. He was evaluated in the context of the global COVID-19 pandemic, which necessitated consideration that the patient might be at risk for infection with the SARS-CoV-2 virus that causes COVID-19. Institutional protocols and algorithms that pertain to the evaluation of patients at risk for COVID-19 are in a state of rapid change based on information released by regulatory bodies including the CDC and federal and state organizations. These policies and algorithms were followed during the patient's care in the ED.  Some ED evaluations and interventions may be delayed as a result of limited staffing during and the pandemic.*   Note:   This document was prepared using Dragon voice recognition software and may include unintentional dictation errors.   Dakwon Wenberg, Layla Maw, DO 12/11/20 [phone]

## 2020-12-11 NOTE — ED Triage Notes (Signed)
Pt endorses lower back pain x2 years, worse since 11pm last night. Pt states pain is 8/10 currently, tingling in LLE since pain started. States was lifting yesterday and may have injured back. Denies urinary or bowel issues, fever, or other associated symptoms.

## 2020-12-11 NOTE — ED Notes (Signed)
Pt alert and oriented X 4, stable for discharge. RR even and unlabored, color WNL. Discussed discharge instructions and follow-up as directed. Discharge medications discussed if prescribed. Pt had opportunity to ask questions, and RN to provide patient/family eduction.  

## 2020-12-11 NOTE — ED Notes (Signed)
Message sent to pharmacy to send robaxin dose.

## 2020-12-11 NOTE — ED Notes (Signed)
Pt able to ambulate safely. EDP at bedside to explain disposition.

## 2020-12-11 NOTE — ED Provider Notes (Signed)
-----------------------------------------   7:01 AM on 12/11/2020 -----------------------------------------  Blood pressure (!) 151/99, pulse 60, temperature 97.8 F (36.6 C), temperature source Oral, resp. rate 16, height 5\' 11"  (1.803 m), weight 103.4 kg, SpO2 97 %.  Assuming care from Dr. .  In short, MASUD HOLUB is a 30 y.o. male with a chief complaint of Back Pain .  Refer to the original H&P for additional details.  The current plan of care is to reassess following pain medication.  ----------------------------------------- 8:13 AM on 12/11/2020 -----------------------------------------  Patient reports feeling better and has been able to ambulate here in the department.  No signs or symptoms of cauda equina at this time.  Patient is appropriate for discharge home with PCP follow-up for outpatient MRI.  He was prescribed pain medication per Dr. 12/13/2020 and was counseled to return to the ED for any new or worsening symptoms, patient agrees with plan.    Elesa Massed, MD 12/11/20 [phone]

## 2020-12-11 NOTE — Discharge Instructions (Addendum)
Steps to find a Primary Care Provider (PCP):  Call [phone] or [phone] to access "[place] Find a Doctor Service."  2.  You may also go on the [facility] website at [email or web address]   You are being provided a prescription for opiates (also known as narcotics) for pain control.  Opiates can be addictive and should only be used when absolutely necessary for pain control when other alternatives do not work.  We recommend you only use them for the recommended amount of time and only as prescribed.  Please do not take with other sedative medications or alcohol.  Please do not drive, operate machinery, make important decisions while taking opiates.  Please note that these medications can be addictive and have high abuse potential.  Patients can become addicted to narcotics after only taking them for a few days.  Please keep these medications locked away from children, teenagers or any family members with history of substance abuse.  Narcotic pain medicine may also make you constipated.  You may use over-the-counter medications such as MiraLAX, Colace to prevent constipation.  If you become constipated you may use over-the-counter enemas as needed.  Itching and nausea are common side effects of narcotic pain medication.  If you develop uncontrolled vomiting or a rash, please stop these medications.
